# Patient Record
Sex: Female | Born: 1995 | Race: White | Hispanic: Yes | Marital: Single | Smoking: Never smoker
Health system: Southern US, Community
[De-identification: ages and names within clinical notes are randomized; demographics above are authoritative.]

## PROBLEM LIST (undated history)

## (undated) HISTORY — PX: WISDOM TOOTH EXTRACTION: SHX21

---
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
## 2017-01-05 ENCOUNTER — Ambulatory Visit: Payer: Medicaid Other | Admitting: Podiatry

## 2017-08-04 ENCOUNTER — Encounter: Payer: Self-pay | Admitting: Emergency Medicine

## 2017-08-04 DIAGNOSIS — L02415 Cutaneous abscess of right lower limb: Secondary | ICD-10-CM | POA: Diagnosis present

## 2017-08-04 DIAGNOSIS — L03115 Cellulitis of right lower limb: Secondary | ICD-10-CM | POA: Insufficient documentation

## 2017-08-04 NOTE — ED Triage Notes (Signed)
Patient reports noticed "bump" on right upper leg approximately 1 week ago.  States has gotten larger with redness and pain now.

## 2017-08-05 ENCOUNTER — Emergency Department
Admission: EM | Admit: 2017-08-05 | Discharge: 2017-08-05 | Disposition: A | Discharge status: Home / Self Care | Payer: Medicaid Other | Attending: Emergency Medicine | Admitting: Emergency Medicine

## 2017-08-05 DIAGNOSIS — L0291 Cutaneous abscess, unspecified: Secondary | ICD-10-CM

## 2017-08-05 DIAGNOSIS — L03115 Cellulitis of right lower limb: Secondary | ICD-10-CM

## 2017-08-05 LAB — CBC WITH DIFFERENTIAL/PLATELET
Basophils Absolute: 0.1 10*3/uL (ref 0–0.1)
Basophils Relative: 1 %
Eosinophils Absolute: 0 10*3/uL (ref 0–0.7)
Eosinophils Relative: 0 %
HEMATOCRIT: 39.5 % (ref 35.0–47.0)
HEMOGLOBIN: 13.6 g/dL (ref 12.0–16.0)
LYMPHS ABS: 3.4 10*3/uL (ref 1.0–3.6)
LYMPHS PCT: 29 %
MCH: 30.7 pg (ref 26.0–34.0)
MCHC: 34.4 g/dL (ref 32.0–36.0)
MCV: 89.5 fL (ref 80.0–100.0)
MONOS PCT: 5 %
Monocytes Absolute: 0.6 10*3/uL (ref 0.2–0.9)
NEUTROS PCT: 65 %
Neutro Abs: 7.6 10*3/uL — ABNORMAL HIGH (ref 1.4–6.5)
Platelets: 276 10*3/uL (ref 150–440)
RBC: 4.42 MIL/uL (ref 3.80–5.20)
RDW: 12.7 % (ref 11.5–14.5)
WBC: 11.8 10*3/uL — ABNORMAL HIGH (ref 3.6–11.0)

## 2017-08-05 LAB — BASIC METABOLIC PANEL
ANION GAP: 9 (ref 5–15)
BUN: 9 mg/dL (ref 6–20)
CO2: 28 mmol/L (ref 22–32)
Calcium: 9.6 mg/dL (ref 8.9–10.3)
Chloride: 103 mmol/L (ref 101–111)
Creatinine, Ser: 0.88 mg/dL (ref 0.44–1.00)
Glucose, Bld: 88 mg/dL (ref 65–99)
POTASSIUM: 3.5 mmol/L (ref 3.5–5.1)
SODIUM: 140 mmol/L (ref 135–145)

## 2017-08-05 MED ORDER — DEXTROSE 5 % IV SOLN
1.0000 g | Freq: Once | INTRAVENOUS | Status: AC
  Administered 2017-08-05: 1 g via INTRAVENOUS
  Filled 2017-08-05: qty 10

## 2017-08-05 MED ORDER — ONDANSETRON HCL 4 MG/2ML IJ SOLN
4.0000 mg | Freq: Once | INTRAMUSCULAR | Status: AC
  Administered 2017-08-05: 4 mg via INTRAVENOUS
  Filled 2017-08-05: qty 2

## 2017-08-05 MED ORDER — MORPHINE SULFATE (PF) 4 MG/ML IV SOLN
4.0000 mg | Freq: Once | INTRAVENOUS | Status: AC
  Administered 2017-08-05: 4 mg via INTRAVENOUS
  Filled 2017-08-05: qty 1

## 2017-08-05 MED ORDER — CEPHALEXIN 500 MG PO CAPS: 500.0000 mg | ORAL_CAPSULE | Freq: Three times a day (TID) | ORAL | 0 refills | Status: AC

## 2017-08-05 MED ORDER — LIDOCAINE HCL (PF) 1 % IJ SOLN
INTRAMUSCULAR | Status: AC
  Filled 2017-08-05: qty 5

## 2017-08-05 MED ORDER — SULFAMETHOXAZOLE-TRIMETHOPRIM 800-160 MG PO TABS: 2.0000 | ORAL_TABLET | Freq: Two times a day (BID) | ORAL | 0 refills | Status: AC

## 2017-08-05 MED ORDER — IBUPROFEN 600 MG PO TABS: 600.0000 mg | ORAL_TABLET | Freq: Three times a day (TID) | ORAL | 0 refills | Status: AC | PRN

## 2017-08-05 MED ORDER — HYDROCODONE-ACETAMINOPHEN 5-325 MG PO TABS: 1.0000 | ORAL_TABLET | Freq: Four times a day (QID) | ORAL | 0 refills | Status: AC | PRN

## 2017-08-05 MED ORDER — SULFAMETHOXAZOLE-TRIMETHOPRIM 800-160 MG PO TABS
2.0000 | ORAL_TABLET | Freq: Once | ORAL | Status: AC
  Administered 2017-08-05: 2 via ORAL
  Filled 2017-08-05: qty 2

## 2017-08-05 NOTE — ED Notes (Signed)
Patient with abscess to left inner thigh. Patient states that she noticed Sunday and that it has become worse.

## 2017-08-05 NOTE — Discharge Instructions (Signed)
1. Take antibiotics as prescribed: Keflex 500 mg 3 times daily 10 days Septra DS 2 tablets twice daily 10 days 2. You may take pain medicines as needed (Motrin/Norco #15). 3. Leave packing alone until you are seen for wound check in 2 days. 4. Return to the ER for worsening symptoms, persistent vomiting, fever, increased redness/swelling or other concerns.

## 2017-08-05 NOTE — ED Provider Notes (Signed)
[facility] Emergency Department Provider Note   ____________________________________________   First MD Initiated Contact with Patient 08/05/17 [phone]     (approximate)  I have reviewed the triage vital signs and the nursing notes.   HISTORY  Chief Complaint Abscess    HPI Gloria Jacobs is a 21 y.o. female who presents to the ED from home with a chief complaint of "bump" to the right leg. First noticed the painful swollen area on her right inner thigh approximately one week ago. Area has gotten progressively larger with spreading redness and pain. Denies associated fever, chills, chest pain, shortness of breath, abdominal pain, nausea, vomiting. Denies insect bite or trauma. She does shave her upper thighs.   Past medical history None  There are no active problems to display for this patient.   No past surgical history on file.  Prior to Admission medications   Not on File    Allergies Patient has no known allergies.  No family history on file.  Social History Social History  Substance Use Topics  . Smoking status: Not on file  . Smokeless tobacco: Not on file  . Alcohol use Not on file  N/A  Review of Systems  Constitutional: No fever/chills. Eyes: No visual changes. ENT: No sore throat. Cardiovascular: Denies chest pain. Respiratory: Denies shortness of breath. Gastrointestinal: No abdominal pain.  No nausea, no vomiting.  No diarrhea.  No constipation. Genitourinary: Negative for dysuria. Musculoskeletal: Negative for back pain. Skin: positive for abscess. Negative for rash. Neurological: Negative for headaches, focal weakness or numbness.   ____________________________________________   PHYSICAL EXAM:  VITAL SIGNS: ED Triage Vitals  Enc Vitals Group     BP 08/04/17 2346 120/78     Pulse Rate 08/04/17 2346 100     Resp 08/04/17 2346 18     Temp 08/04/17 2346 98.9 F (37.2 C)     Temp Source 08/04/17 2346 Oral   SpO2 08/04/17 2346 100 %     Weight --      Height --      Head Circumference --      Peak Flow --      Pain Score 08/04/17 2350 2     Pain Loc --      Pain Edu? --      Excl. in [place]? --     Constitutional: Alert and oriented. Well appearing and in no acute distress. Eyes: Conjunctivae are normal. PERRL. EOMI. Head: Atraumatic. Nose: No congestion/rhinnorhea. Mouth/Throat: Mucous membranes are moist.  Oropharynx non-erythematous. Neck: No stridor.   Cardiovascular: Normal rate, regular rhythm. Grossly normal heart sounds.  Good peripheral circulation. Respiratory: Normal respiratory effort.  No retractions. Lungs CTAB. Gastrointestinal: Soft and nontender. No distention. No abdominal bruits. No CVA tenderness. Musculoskeletal: Palm-sized area of warmth and erythema to right inner thigh with central 3 x 3 cm area of fluctuance. No pedal edema.  No joint effusions. Neurologic:  Normal speech and language. No gross focal neurologic deficits are appreciated.  Skin:  Skin is warm, dry and intact. No rash noted. Psychiatric: Mood and affect are normal. Speech and behavior are normal.  ____________________________________________   LABS (all labs ordered are listed, but only abnormal results are displayed)  Labs Reviewed  CBC WITH DIFFERENTIAL/PLATELET - Abnormal; Notable for the following:       Result Value   WBC 11.8 (*)    Neutro Abs 7.6 (*)    All other components within normal limits  CULTURE, BLOOD (ROUTINE X  2)  CULTURE, BLOOD (ROUTINE X 2)  BASIC METABOLIC PANEL   ____________________________________________  EKG  none ____________________________________________  RADIOLOGY  No results found.  ____________________________________________   PROCEDURES  Procedure(s) performed:  INCISION AND DRAINAGE Performed by: Irean Hong Consent: Verbal consent obtained. Risks and benefits: risks, benefits and alternatives were discussed Type: abscess  Body area:  right inner thigh  Anesthesia: local infiltration  Incision was made with a scalpel.  Local anesthetic: lidocaine 1% w/o epinephrine  Anesthetic total: 10 ml  Complexity: complex Blunt dissection to break up loculations  Drainage: purulent  Drainage amount: large  Packing material: 1/4 in iodoform gauze  Patient tolerance: Patient tolerated the procedure well with no immediate complications.    Procedures  Critical Care performed: No  ____________________________________________   INITIAL IMPRESSION / ASSESSMENT AND PLAN / ED COURSE  Pertinent labs & imaging results that were available during my care of the patient were reviewed by me and considered in my medical decision making (see chart for details).  21 year old female who presents with abscess and cellulitis to right inner thigh. Will obtain screening lab work, IV antibiotics and perform incision and drainage.  Clinical Course as of Aug 05 632  Wynelle Link Aug 05, 2017  1610 Patient tolerated I&D well. Updated patient and mother of laboratory results. She is to either see her PCP or return to the ED in 2 days for wound check. Will discharge home on Keflex and Septra. Prescription for analgesia as needed. Strict return precautions given. Both verbalize understanding and agree with plan of care.  [JS]    Clinical Course User Index [JS] Irean Hong, MD     ____________________________________________   FINAL CLINICAL IMPRESSION(S) / ED DIAGNOSES  Final diagnoses:  Abscess  Cellulitis of right lower extremity      NEW MEDICATIONS STARTED DURING THIS VISIT:  New Prescriptions   No medications on file     Note:  This document was prepared using Dragon voice recognition software and may include unintentional dictation errors.    Irean Hong, MD 08/05/17 [phone]

## 2017-08-10 LAB — CULTURE, BLOOD (ROUTINE X 2)
CULTURE: NO GROWTH
Culture: NO GROWTH
SPECIAL REQUESTS: ADEQUATE
Special Requests: ADEQUATE

## 2020-03-04 ENCOUNTER — Ambulatory Visit: Payer: Medicaid Other | Attending: Internal Medicine

## 2020-03-04 DIAGNOSIS — Z23 Encounter for immunization: Secondary | ICD-10-CM

## 2020-03-04 NOTE — Progress Notes (Signed)
   Covid-19 Vaccination Clinic  Name:  Gloria Jacobs    MRN: [id number] DOB: 10/11/1996  03/04/2020  [name] was observed post Covid-19 immunization for 15 minutes without incident. She was provided with Vaccine Information Sheet and instruction to access the V-Safe system.   [name] was instructed to call 911 with any severe reactions post vaccine: Marland Kitchen Difficulty breathing  . Swelling of face and throat  . A fast heartbeat  . A bad rash all over body  . Dizziness and weakness   Immunizations Administered    Name Date Dose VIS Date Route   Pfizer COVID-19 Vaccine 03/04/2020  2:40 PM 0.3 mL 11/07/2019 Intramuscular   Manufacturer: [employer], [employer]   Lot: [id number]   NDC: [address]-9

## 2020-03-19 ENCOUNTER — Other Ambulatory Visit: Payer: Self-pay

## 2020-03-19 ENCOUNTER — Encounter: Payer: Self-pay | Admitting: Emergency Medicine

## 2020-03-19 DIAGNOSIS — L509 Urticaria, unspecified: Secondary | ICD-10-CM | POA: Insufficient documentation

## 2020-03-19 DIAGNOSIS — R21 Rash and other nonspecific skin eruption: Secondary | ICD-10-CM | POA: Diagnosis present

## 2020-03-19 DIAGNOSIS — T7840XA Allergy, unspecified, initial encounter: Secondary | ICD-10-CM | POA: Diagnosis not present

## 2020-03-19 NOTE — ED Triage Notes (Signed)
Patient states that she started developing a rash all over 2 days ago. Patient states that it became worse today. Patient states that she took benadryl about an hour ago.

## 2020-03-20 ENCOUNTER — Emergency Department
Admission: EM | Admit: 2020-03-20 | Discharge: 2020-03-20 | Disposition: A | Discharge status: Home / Self Care | Payer: BC Managed Care – PPO | Attending: Emergency Medicine | Admitting: Emergency Medicine

## 2020-03-20 DIAGNOSIS — R21 Rash and other nonspecific skin eruption: Secondary | ICD-10-CM

## 2020-03-20 DIAGNOSIS — L509 Urticaria, unspecified: Secondary | ICD-10-CM

## 2020-03-20 MED ORDER — PREDNISONE 10 MG PO TABS: 10.0000 mg | ORAL_TABLET | Freq: Every day | ORAL | 0 refills | Status: AC

## 2020-03-20 MED ORDER — PREDNISONE 20 MG PO TABS
60.0000 mg | ORAL_TABLET | Freq: Once | ORAL | Status: AC
  Administered 2020-03-20: 02:00:00 60 mg via ORAL
  Filled 2020-03-20: qty 3

## 2020-03-20 NOTE — ED Provider Notes (Signed)
[facility] Emergency Department Provider Note  Time seen: 2:13 AM  I have reviewed the triage vital signs and the nursing notes.   HISTORY  Chief Complaint Rash   HPI Gloria Jacobs is a 24 y.o. female with no past medical history presents to the emergency department for hives over the past 3 days.  According to the patient 3 days ago she noticed hives across her body, states she took a shower but still had hives.  States they have been coming and going over the past 3 days.  Patient took Benadryl just prior to arrival and states that the hives are much improved but still present.  Patient denies any known allergies.  Denies any new exposures new foods new detergents, etc.  Denies any shortness of breath or wheeze.   History reviewed. No pertinent past medical history.  There are no problems to display for this patient.   Past Surgical History:  Procedure Laterality Date  . WISDOM TOOTH EXTRACTION      Prior to Admission medications   Medication Sig Start Date End Date Taking? Authorizing Provider  cephALEXin (KEFLEX) 500 MG capsule Take 1 capsule (500 mg total) by mouth 3 (three) times daily. 08/05/17   Paulette Blanch, MD  HYDROcodone-acetaminophen (NORCO) 5-325 MG tablet Take 1 tablet by mouth every 6 (six) hours as needed for moderate pain. 08/05/17   Paulette Blanch, MD  ibuprofen (ADVIL,MOTRIN) 600 MG tablet Take 1 tablet (600 mg total) by mouth every 8 (eight) hours as needed. 08/05/17   Paulette Blanch, MD  sulfamethoxazole-trimethoprim (BACTRIM DS,SEPTRA DS) 800-160 MG tablet Take 2 tablets by mouth 2 (two) times daily. 08/05/17   Paulette Blanch, MD    No Known Allergies  No family history on file.  Social History Social History   Tobacco Use  . Smoking status: Never Smoker  . Smokeless tobacco: Never Used  Substance Use Topics  . Alcohol use: Yes    Comment: occ  . Drug use: Never    Review of Systems Constitutional: Negative for fever. Cardiovascular:  Negative for chest pain. Respiratory: Negative for shortness of breath. Gastrointestinal: Negative for abdominal pain Musculoskeletal: Negative for musculoskeletal complaints Skin: Hives over her body. Neurological: Negative for headache All other ROS negative  ____________________________________________   PHYSICAL EXAM:  VITAL SIGNS: ED Triage Vitals [03/19/20 2318]  Enc Vitals Group     BP (!) 143/78     Pulse Rate 96     Resp 18     Temp 98.3 F (36.8 C)     Temp Source Oral     SpO2 100 %     Weight 170 lb (77.1 kg)     Height 5\' 4"  (1.626 m)     Head Circumference      Peak Flow      Pain Score 0     Pain Loc      Pain Edu?      Excl. in [place]?     Constitutional: Alert and oriented. Well appearing and in no distress. Eyes: Normal exam ENT      Head: Normocephalic and atraumatic.      Mouth/Throat: Mucous membranes are moist. Cardiovascular: Normal rate, regular rhythm.  Respiratory: Normal respiratory effort without tachypnea nor retractions. Breath sounds are clear.  No wheeze. Gastrointestinal: Soft and nontender. No distention.   Musculoskeletal: Nontender with normal range of motion in all extremities.  Neurologic:  Normal speech and language. No gross focal neurologic deficits  Skin:  Skin is warm.  Patient has several hives across her hands, upper extremities and back.  Mom states it is much improved from earlier tonight before the patient took Benadryl. Psychiatric: Mood and affect are normal.   ____________________________________________   INITIAL IMPRESSION / ASSESSMENT AND PLAN / ED COURSE  Pertinent labs & imaging results that were available during my care of the patient were reviewed by me and considered in my medical decision making (see chart for details).   Patient presents to the emergency department for rash.  Intermittent hives over the past 3 days.  Overall the patient appears well, no distress.  No wheeze or shortness of breath at any  point.  No throat swelling.  Patient does appear to have intermittent urticaria.  No new exposure identified.  We will place the patient on a prednisone taper.  Also discussed Benadryl or Zyrtec use as needed.  Patient agreeable to plan of care.  Gloria Jacobs was evaluated in Emergency Department on 03/20/2020 for the symptoms described in the history of present illness. She was evaluated in the context of the global COVID-19 pandemic, which necessitated consideration that the patient might be at risk for infection with the SARS-CoV-2 virus that causes COVID-19. Institutional protocols and algorithms that pertain to the evaluation of patients at risk for COVID-19 are in a state of rapid change based on information released by regulatory bodies including the CDC and federal and state organizations. These policies and algorithms were followed during the patient's care in the ED.  ____________________________________________   FINAL CLINICAL IMPRESSION(S) / ED DIAGNOSES  Hives Allergic reaction   Minna Antis, MD 03/20/20 [phone]

## 2020-03-29 ENCOUNTER — Ambulatory Visit: Payer: Medicaid Other

## 2020-04-06 ENCOUNTER — Ambulatory Visit: Payer: BC Managed Care – PPO | Attending: Internal Medicine

## 2020-04-06 DIAGNOSIS — Z23 Encounter for immunization: Secondary | ICD-10-CM

## 2020-04-06 NOTE — Progress Notes (Signed)
   Covid-19 Vaccination Clinic  Name:  Brooklen Runquist    MRN: [id number] DOB: [birth year]  04/06/2020  Ms. Raynor was observed post Covid-19 immunization for 15 minutes without incident. She was provided with Vaccine Information Sheet and instruction to access the V-Safe system.   Ms. Berroa was instructed to call 911 with any severe reactions post vaccine: Marland Kitchen Difficulty breathing  . Swelling of face and throat  . A fast heartbeat  . A bad rash all over body  . Dizziness and weakness   Immunizations Administered    Name Date Dose VIS Date Route   Pfizer COVID-19 Vaccine 04/06/2020  2:02 PM 0.3 mL 01/21/2019 Intramuscular   Manufacturer: [employer], [employer]   Lot: [id number]   NDC: [address]-1

## 2020-06-16 ENCOUNTER — Ambulatory Visit: Payer: BC Managed Care – PPO | Admitting: Obstetrics

## 2020-07-12 ENCOUNTER — Ambulatory Visit (INDEPENDENT_AMBULATORY_CARE_PROVIDER_SITE_OTHER): Payer: BC Managed Care – PPO | Admitting: Obstetrics

## 2020-07-12 ENCOUNTER — Other Ambulatory Visit (HOSPITAL_COMMUNITY)
Admission: RE | Admit: 2020-07-12 | Discharge: 2020-07-12 | Disposition: A | Discharge status: Home / Self Care | Payer: BC Managed Care – PPO | Source: Ambulatory Visit | Attending: Obstetrics | Admitting: Obstetrics

## 2020-07-12 ENCOUNTER — Other Ambulatory Visit: Payer: Self-pay

## 2020-07-12 ENCOUNTER — Encounter: Payer: Self-pay | Admitting: Obstetrics

## 2020-07-12 VITALS — BP 122/74 | Ht 64.0 in | Wt 177.0 lb

## 2020-07-12 DIAGNOSIS — Z124 Encounter for screening for malignant neoplasm of cervix: Secondary | ICD-10-CM | POA: Insufficient documentation

## 2020-07-12 DIAGNOSIS — Z30016 Encounter for initial prescription of transdermal patch hormonal contraceptive device: Secondary | ICD-10-CM | POA: Diagnosis not present

## 2020-07-12 DIAGNOSIS — Z113 Encounter for screening for infections with a predominantly sexual mode of transmission: Secondary | ICD-10-CM | POA: Diagnosis not present

## 2020-07-12 DIAGNOSIS — Z01419 Encounter for gynecological examination (general) (routine) without abnormal findings: Secondary | ICD-10-CM | POA: Insufficient documentation

## 2020-07-12 MED ORDER — XULANE 150-35 MCG/24HR TD PTWK: 1.0000 | MEDICATED_PATCH | TRANSDERMAL | 2 refills | Status: DC

## 2020-07-12 NOTE — Progress Notes (Signed)
Gynecology Annual Exam  PCP: Patient, No Pcp Per  Chief Complaint:  Chief Complaint  Patient presents with  . Annual Exam    History of Present Illness: Patient is a 24 y.o. G0P0000 presents for annual exam. The patient has no complaints today. She has become sexually active and they have used condoms. She is interested in other methods, so in addition to her first annual GYN physical, she wants to discuss birth control. 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  LMP: Patient's last menstrual period was 07/08/2020. Menarche:14 Average Interval: regular, 28 days Duration of flow: 7 days Heavy Menses: no Clots: no Intermenstrual Bleeding: no Postcoital Bleeding: no Dysmenorrhea: no  The patient is sexually active. She currently uses condoms for contraception. She denies dyspareunia.  The patient does not perform self breast exams.  There is no notable family history of breast or ovarian cancer in her family.  The patient wears seatbelts: yes.  The patient has regular exercise: not asked.    The patient denies current symptoms of depression.    Review of Systems: ROS  Past Medical History:  Patient Active Problem List   Diagnosis Date Noted  . Well woman exam with routine gynecological exam 07/12/2020    Past Surgical History:  Past Surgical History:  Procedure Laterality Date  . WISDOM TOOTH EXTRACTION      Gynecologic History:  Patient's last menstrual period was 07/08/2020. Contraception: condoms and would like to select another method Last Pap: Results were: none. This is her first pap.   Obstetric History: G0P0000  Family History:  No family history on file.  Social History:  Social History   Socioeconomic History  . Marital status: Single    Spouse name: Not on file  . Number of children: Not on file  . Years of education: Not on file  . Highest education level: Not on file  Occupational History  . Not on file  Tobacco Use  .  Smoking status: Never Smoker  . Smokeless tobacco: Never Used  Substance and Sexual Activity  . Alcohol use: Yes    Comment: occ  . Drug use: Never  . Sexual activity: Yes    Birth control/protection: None  Other Topics Concern  . Not on file  Social History Narrative  . Not on file   Social Determinants of Health   Financial Resource Strain:   . Difficulty of Paying Living Expenses:   Food Insecurity:   . Worried About Programme researcher, broadcasting/film/video in the Last Year:   . Barista in the Last Year:   Transportation Needs:   . Freight forwarder (Medical):   Marland Kitchen Lack of Transportation (Non-Medical):   Physical Activity:   . Days of Exercise per Week:   . Minutes of Exercise per Session:   Stress:   . Feeling of Stress :   Social Connections:   . Frequency of Communication with Friends and Family:   . Frequency of Social Gatherings with Friends and Family:   . Attends Religious Services:   . Active Member of Clubs or Organizations:   . Attends Banker Meetings:   Marland Kitchen Marital Status:   Intimate Partner Violence:   . Fear of Current or Ex-Partner:   . Emotionally Abused:   Marland Kitchen Physically Abused:   . Sexually Abused:     Allergies:  No Known Allergies  Medications: Prior to Admission medications   Medication Sig Start Date  End Date Taking? Authorizing Provider  cephALEXin (KEFLEX) 500 MG capsule Take 1 capsule (500 mg total) by mouth 3 (three) times daily. Patient not taking: Reported on 07/12/2020 08/05/17   Irean Hong, MD  HYDROcodone-acetaminophen [facility]) 5-325 MG tablet Take 1 tablet by mouth every 6 (six) hours as needed for moderate pain. Patient not taking: Reported on 07/12/2020 08/05/17   Irean Hong, MD  ibuprofen (ADVIL,MOTRIN) 600 MG tablet Take 1 tablet (600 mg total) by mouth every 8 (eight) hours as needed. Patient not taking: Reported on 07/12/2020 08/05/17   Irean Hong, MD  norelgestromin-ethinyl estradiol [facility]) 150-35 MCG/24HR transdermal patch  Place 1 patch onto the skin once a week. 07/12/20   Mirna Mires, CNM  predniSONE (DELTASONE) 10 MG tablet Take 1 tablet (10 mg total) by mouth daily. Day 1-3: take 4 tablets PO daily Day 4-6: take 3 tablets PO daily Day 7-9: take 2 tablets PO daily Day 10-12: take 1 tablet PO daily Patient not taking: Reported on 07/12/2020 03/20/20   Minna Antis, MD  sulfamethoxazole-trimethoprim (BACTRIM DS,SEPTRA DS) 800-160 MG tablet Take 2 tablets by mouth 2 (two) times daily. Patient not taking: Reported on 07/12/2020 08/05/17   Irean Hong, MD    Physical Exam Vitals: Blood pressure 122/74, height 5\' 4"  (1.626 m), weight 177 lb (80.3 kg), last menstrual period 07/08/2020.  General: NAD HEENT: normocephalic, anicteric Thyroid: no enlargement, no palpable nodules Pulmonary: No increased work of breathing, CTAB Cardiovascular: RRR, distal pulses 2+ Breast: Breast symmetrical, no tenderness, no palpable nodules or masses, no skin or nipple retraction present, no nipple discharge.  No axillary or supraclavicular lymphadenopathy. Abdomen: NABS, soft, non-tender, non-distended.  Umbilicus without lesions.  No hepatomegaly, splenomegaly or masses palpable. No evidence of hernia  Genitourinary:  External: Normal external female genitalia.  Normal urethral meatus, normal Bartholin's and Skene's glands.    Vagina: Normal vaginal mucosa, no evidence of prolapse.    Cervix: Grossly normal in appearance, no bleeding  Uterus: Non-enlarged, mobile, normal contour.  No CMT  Adnexa: ovaries non-enlarged, no adnexal masses  Rectal: deferred  Lymphatic: no evidence of inguinal lymphadenopathy Extremities: no edema, erythema, or tenderness Neurologic: Grossly intact Psychiatric: mood appropriate, affect full  Female chaperone present for pelvic and breast  portions of the physical exam    Assessment: 24 y.o. G0P0000 routine annual exam  Plan: Problem List Items Addressed This Visit      Other    Well woman exam with routine gynecological exam   Relevant Orders   Pap IG w/ reflex to HPV when ASC-U   HEP, RPR, HIV Panel   NuSwab Vaginitis Plus (VG+)    Other Visit Diagnoses    Women's annual routine gynecological examination    -  Primary   Relevant Medications   norelgestromin-ethinyl estradiol (XULANE) 150-35 MCG/24HR transdermal patch   Other Relevant Orders   Pap IG w/ reflex to HPV when ASC-U   HEP, RPR, HIV Panel   NuSwab Vaginitis Plus (VG+)   Cervical cancer screening       Relevant Orders   Pap IG w/ reflex to HPV when ASC-U   HEP, RPR, HIV Panel   Routine screening for STI (sexually transmitted infection)       Relevant Orders   HEP, RPR, HIV Panel   NuSwab Vaginitis Plus (VG+)   Encounter for initial prescription of transdermal patch hormonal contraceptive device       Relevant Medications   norelgestromin-ethinyl estradiol 21) 150-35  MCG/24HR transdermal patch      1) 4) Gardasil Series discussed and if applicable offered to patient - Patient has previously completed 3 shot series   2) STI screening  wasoffered and accepted  3)  ASCCP guidelines and rational discussed.  Patient opts for yearly screening interval  4) Contraception - the patient is currently using  contraceptive patch, ie the Xulane. She wants to trial this for the next three months, and will return to discuss whether this is her ongoing method of choice..  She is interested in starting Contraception: as mentioned- the patch. We discussed safe sex practices to reduce her furture risk of STI's.    5) Return in about 3 months (around 10/12/2020) for contraceptive follow up on the patch (xulane).   Mirna Mires, CNM  07/12/2020 6:03 PM    07/12/2020, 5:51 PM

## 2020-07-13 LAB — HEP, RPR, HIV PANEL
HIV Screen 4th Generation wRfx: NONREACTIVE
Hepatitis B Surface Ag: NEGATIVE
RPR Ser Ql: NONREACTIVE

## 2020-07-14 ENCOUNTER — Encounter: Payer: Self-pay | Admitting: Obstetrics

## 2020-07-14 LAB — NUSWAB VAGINITIS PLUS (VG+)
Candida albicans, NAA: NEGATIVE
Candida glabrata, NAA: NEGATIVE
Chlamydia trachomatis, NAA: NEGATIVE
Neisseria gonorrhoeae, NAA: NEGATIVE
Trich vag by NAA: NEGATIVE

## 2020-07-15 LAB — CYTOLOGY - PAP
Comment: NEGATIVE
Diagnosis: NEGATIVE
High risk HPV: NEGATIVE

## 2020-07-19 ENCOUNTER — Encounter: Payer: Self-pay | Admitting: Obstetrics

## 2020-07-28 ENCOUNTER — Other Ambulatory Visit: Payer: Self-pay | Admitting: Sleep Medicine

## 2020-07-28 ENCOUNTER — Other Ambulatory Visit: Payer: BC Managed Care – PPO

## 2020-07-28 DIAGNOSIS — I471 Supraventricular tachycardia, unspecified: Secondary | ICD-10-CM

## 2020-07-29 LAB — NOVEL CORONAVIRUS, NAA: SARS-CoV-2, NAA: NOT DETECTED

## 2020-10-14 ENCOUNTER — Ambulatory Visit: Payer: BC Managed Care – PPO | Admitting: Obstetrics and Gynecology

## 2020-10-15 ENCOUNTER — Ambulatory Visit: Payer: BC Managed Care – PPO | Admitting: Obstetrics

## 2021-10-27 ENCOUNTER — Ambulatory Visit: Payer: BC Managed Care – PPO | Admitting: Obstetrics

## 2021-10-27 ENCOUNTER — Encounter: Payer: Self-pay | Admitting: Obstetrics

## 2021-10-27 ENCOUNTER — Other Ambulatory Visit: Payer: Self-pay

## 2021-10-27 ENCOUNTER — Other Ambulatory Visit (HOSPITAL_COMMUNITY)
Admission: RE | Admit: 2021-10-27 | Discharge: 2021-10-27 | Disposition: A | Discharge status: Home / Self Care | Payer: Medicaid Other | Source: Ambulatory Visit | Attending: Obstetrics | Admitting: Obstetrics

## 2021-10-27 VITALS — BP 100/60 | Ht 64.0 in | Wt 181.0 lb

## 2021-10-27 DIAGNOSIS — Z113 Encounter for screening for infections with a predominantly sexual mode of transmission: Secondary | ICD-10-CM

## 2021-10-27 DIAGNOSIS — Z124 Encounter for screening for malignant neoplasm of cervix: Secondary | ICD-10-CM | POA: Diagnosis present

## 2021-10-27 DIAGNOSIS — Z3009 Encounter for other general counseling and advice on contraception: Secondary | ICD-10-CM

## 2021-10-27 DIAGNOSIS — Z23 Encounter for immunization: Secondary | ICD-10-CM

## 2021-10-27 DIAGNOSIS — Z01419 Encounter for gynecological examination (general) (routine) without abnormal findings: Secondary | ICD-10-CM

## 2021-10-27 MED ORDER — NORELGESTROMIN-ETH ESTRADIOL 150-35 MCG/24HR TD PTWK: 1.0000 | MEDICATED_PATCH | TRANSDERMAL | 3 refills | Status: AC

## 2021-10-28 LAB — HEP, RPR, HIV PANEL
HIV Screen 4th Generation wRfx: NONREACTIVE
Hepatitis B Surface Ag: NEGATIVE
RPR Ser Ql: NONREACTIVE

## 2021-10-30 ENCOUNTER — Encounter: Payer: Self-pay | Admitting: Obstetrics

## 2021-10-31 LAB — CERVICOVAGINAL ANCILLARY ONLY
Bacterial Vaginitis (gardnerella): POSITIVE — AB
Candida Glabrata: NEGATIVE
Candida Vaginitis: POSITIVE — AB
Chlamydia: NEGATIVE
Comment: NEGATIVE
Comment: NEGATIVE
Comment: NEGATIVE
Comment: NEGATIVE
Comment: NEGATIVE
Comment: NORMAL
Neisseria Gonorrhea: NEGATIVE
Trichomonas: NEGATIVE

## 2021-11-01 ENCOUNTER — Encounter: Payer: Self-pay | Admitting: Obstetrics

## 2021-11-01 ENCOUNTER — Other Ambulatory Visit (INDEPENDENT_AMBULATORY_CARE_PROVIDER_SITE_OTHER): Payer: BC Managed Care – PPO | Admitting: Obstetrics

## 2021-11-01 DIAGNOSIS — B9689 Other specified bacterial agents as the cause of diseases classified elsewhere: Secondary | ICD-10-CM

## 2021-11-01 DIAGNOSIS — N760 Acute vaginitis: Secondary | ICD-10-CM | POA: Diagnosis not present

## 2021-11-01 DIAGNOSIS — B3731 Acute candidiasis of vulva and vagina: Secondary | ICD-10-CM | POA: Diagnosis not present

## 2021-11-01 MED ORDER — FLUCONAZOLE 150 MG PO TABS: 150.0000 mg | ORAL_TABLET | Freq: Once | ORAL | 0 refills | Status: AC

## 2021-11-01 MED ORDER — METRONIDAZOLE 500 MG PO TABS: 500.0000 mg | ORAL_TABLET | Freq: Two times a day (BID) | ORAL | 0 refills | Status: AC

## 2021-11-01 NOTE — Progress Notes (Addendum)
Gynecology Annual Exam  PCP: Patient, No Pcp Per (Inactive)  Chief Complaint: No chief complaint on file.   History of Present Illness:  Ms. Gloria Jacobs is a 25 y.o. G0P0000 who LMP was Patient's last menstrual period was 10/07/2021., presents today for her annual examination.  Her menses are regular every 28-30 days, lasting 5 day(s).  Dysmenorrhea none. She does not have intermenstrual bleeding.  She is single partner, contraception - condoms most of the time.  Last Pap: July 02, 2021  Results were: no abnormalities /neg HPV DNA negative Hx of STDs: none  There is no FH of breast cancer. There is no FH of ovarian cancer. The patient does not do self-breast exams.  Tobacco use: The patient denies current or previous tobacco use. Alcohol use: social drinker Exercise: not active    The patient wears seatbelts: yes.   The patient reports that domestic violence in her life is absent.   No past medical history on file.  Past Surgical History:  Procedure Laterality Date   WISDOM TOOTH EXTRACTION      Prior to Admission medications   Medication Sig Start Date End Date Taking? Authorizing Provider  cephALEXin (KEFLEX) 500 MG capsule Take 1 capsule (500 mg total) by mouth 3 (three) times daily. Patient not taking: Reported on 07/12/2020 08/05/17   Irean Hong, MD  HYDROcodone-acetaminophen [facility]) 5-325 MG tablet Take 1 tablet by mouth every 6 (six) hours as needed for moderate pain. Patient not taking: Reported on 07/12/2020 08/05/17   Irean Hong, MD  ibuprofen (ADVIL,MOTRIN) 600 MG tablet Take 1 tablet (600 mg total) by mouth every 8 (eight) hours as needed. Patient not taking: Reported on 07/12/2020 08/05/17   Irean Hong, MD  norelgestromin-ethinyl estradiol [facility]) 150-35 MCG/24HR transdermal patch Place 1 patch onto the skin once a week. 10/27/21   Mirna Mires, CNM  predniSONE (DELTASONE) 10 MG tablet Take 1 tablet (10 mg total) by mouth daily. Day 1-3: take 4 tablets PO  daily Day 4-6: take 3 tablets PO daily Day 7-9: take 2 tablets PO daily Day 10-12: take 1 tablet PO daily Patient not taking: Reported on 07/12/2020 03/20/20   Minna Antis, MD  sulfamethoxazole-trimethoprim (BACTRIM DS,SEPTRA DS) 800-160 MG tablet Take 2 tablets by mouth 2 (two) times daily. Patient not taking: Reported on 07/12/2020 08/05/17   Irean Hong, MD    No Known Allergies  Gynecologic History: Patient's last menstrual period was 10/07/2021. History of abnormal pap smear: No History of STI: No   Obstetric History: G0P0000  Social History   Socioeconomic History   Marital status: Single    Spouse name: Not on file   Number of children: Not on file   Years of education: Not on file   Highest education level: Not on file  Occupational History   Not on file  Tobacco Use   Smoking status: Never   Smokeless tobacco: Never  Substance and Sexual Activity   Alcohol use: Yes    Comment: occ   Drug use: Never   Sexual activity: Yes    Birth control/protection: None  Other Topics Concern   Not on file  Social History Narrative   Not on file   Social Determinants of Health   Financial Resource Strain: Not on file  Food Insecurity: Not on file  Transportation Needs: Not on file  Physical Activity: Not on file  Stress: Not on file  Social Connections: Not on file  Intimate Partner Violence: Not  on file    No family history on file.  ROS   Physical Exam LMP 10/07/2021    OBGyn Exam  Female chaperone present for pelvic and breast  portions of the physical exam  Results:  PHQ-9: 5   Assessment: 25 y.o. G0P0000 female here for routine annual gynecologic examination  Plan: Problem List Items Addressed This Visit   None Visit Diagnoses     Yeast infection of the vagina    -  Primary   BV (bacterial vaginosis)           Screening: -- Blood pressure screen normal -- Weight screening: overweight: continue to monitor -- Depression screening  negative (PHQ-9) -- Nutrition: normal -- cholesterol screening: not due for screening -- osteoporosis screening: not due -- tobacco screening: not using -- alcohol screening: AUDIT questionnaire indicates low-risk usage. -- family history of breast cancer screening: done. not at high risk. -- no evidence of domestic violence or intimate partner violence. -- STD screening: gonorrhea/chlamydia NAAT collected -- pap smear not collected per ASCCP guidelines -- flu vaccine  per PCP -- HPV vaccination series: received  Mirna Mires, CNM  11/14/2021 2:25 PM   11/14/2021 2:21 PM     Patient does not have MyChart set up,o called to let her know that she showed BV and Candida on her Nuswab.

## 2021-11-04 ENCOUNTER — Other Ambulatory Visit: Payer: Self-pay | Admitting: Obstetrics

## 2021-11-04 DIAGNOSIS — B3731 Acute candidiasis of vulva and vagina: Secondary | ICD-10-CM

## 2021-11-04 MED ORDER — FLUCONAZOLE 150 MG PO TABS: 150.0000 mg | ORAL_TABLET | Freq: Once | ORAL | 0 refills | Status: AC

## 2021-11-14 LAB — CYTOLOGY - PAP

## 2021-11-14 NOTE — Addendum Note (Signed)
Addended by: Mirna Mires on: 11/14/2021 02:25 PM   Modules accepted: Level of Service

## 2021-11-16 ENCOUNTER — Encounter: Payer: Self-pay | Admitting: Obstetrics

## 2021-11-18 NOTE — Progress Notes (Unsigned)
Attempted to contact patient to discuss her LGSIL pap results. She is to repeat the pap in one year.she did not answer her phone, and does not have My Chart set up. VM left advising her to call the office after the Christmas holiday to discuss her pap smear result. (LGSIL).  Mirna Mires, CNM  11/18/2021 5:14 PM

## 2022-11-28 ENCOUNTER — Encounter: Payer: Self-pay | Admitting: *Deleted

## 2022-11-28 ENCOUNTER — Other Ambulatory Visit: Payer: Self-pay

## 2022-11-28 ENCOUNTER — Emergency Department: Payer: Medicaid Other

## 2022-11-28 ENCOUNTER — Emergency Department
Admission: EM | Admit: 2022-11-28 | Discharge: 2022-11-28 | Disposition: A | Discharge status: Home / Self Care | Payer: Medicaid Other | Attending: Emergency Medicine | Admitting: Emergency Medicine

## 2022-11-28 DIAGNOSIS — I880 Nonspecific mesenteric lymphadenitis: Secondary | ICD-10-CM | POA: Insufficient documentation

## 2022-11-28 LAB — URINALYSIS, ROUTINE W REFLEX MICROSCOPIC
Bilirubin Urine: NEGATIVE
Glucose, UA: NEGATIVE mg/dL
Hgb urine dipstick: NEGATIVE
Ketones, ur: NEGATIVE mg/dL
Nitrite: NEGATIVE
Protein, ur: NEGATIVE mg/dL
Specific Gravity, Urine: 1.026 (ref 1.005–1.030)
pH: 6 (ref 5.0–8.0)

## 2022-11-28 LAB — COMPREHENSIVE METABOLIC PANEL
ALT: 22 U/L (ref 0–44)
AST: 21 U/L (ref 15–41)
Albumin: 4.1 g/dL (ref 3.5–5.0)
Alkaline Phosphatase: 72 U/L (ref 38–126)
Anion gap: 10 (ref 5–15)
BUN: 12 mg/dL (ref 6–20)
CO2: 24 mmol/L (ref 22–32)
Calcium: 9.2 mg/dL (ref 8.9–10.3)
Chloride: 104 mmol/L (ref 98–111)
Creatinine, Ser: 0.87 mg/dL (ref 0.44–1.00)
GFR, Estimated: >60 mL/min (ref >60)
Glucose, Bld: 92 mg/dL (ref 70–99)
Potassium: 3.6 mmol/L (ref 3.5–5.1)
Sodium: 138 mmol/L (ref 135–145)
Total Bilirubin: 0.6 mg/dL (ref 0.3–1.2)
Total Protein: 7.7 g/dL (ref 6.5–8.1)

## 2022-11-28 LAB — CBC
HCT: 42.7 % (ref 36.0–46.0)
Hemoglobin: 14.5 g/dL (ref 12.0–15.0)
MCH: 30.9 pg (ref 26.0–34.0)
MCHC: 34 g/dL (ref 30.0–36.0)
MCV: 90.9 fL (ref 80.0–100.0)
Platelets: 270 10*3/uL (ref 150–400)
RBC: 4.7 MIL/uL (ref 3.87–5.11)
RDW: 12.5 % (ref 11.5–15.5)
WBC: 11 10*3/uL — ABNORMAL HIGH (ref 4.0–10.5)
nRBC: 0 % (ref 0.0–0.2)

## 2022-11-28 LAB — POC URINE PREG, ED: Preg Test, Ur: NEGATIVE

## 2022-11-28 LAB — LIPASE, BLOOD: Lipase: 37 U/L (ref 11–51)

## 2022-11-28 MED ORDER — IOHEXOL 300 MG/ML  SOLN
100.0000 mL | Freq: Once | INTRAMUSCULAR | Status: AC | PRN
  Administered 2022-11-28: 100 mL via INTRAVENOUS

## 2022-11-28 NOTE — ED Provider Notes (Signed)
[facility] Provider Note  Patient Contact: 6:58 PM (approximate)   History   Abdominal Pain   HPI  Gloria Jacobs is a 27 y.o. female with an unremarkable past medical history, presents to the emergency department with right-sided mid abdominal pain that has occurred for the past 2 to 3 days without associated vomiting, fever or generalized malaise.  Patient states that she has had some viral URI-like symptoms.  No chest pain, chest tightness or shortness of breath.  No dysuria, hematuria or increased urinary frequency.      Physical Exam   Triage Vital Signs: ED Triage Vitals  Enc Vitals Group     BP 11/28/22 1644 118/87     Pulse Rate 11/28/22 1644 92     Resp 11/28/22 1644 18     Temp 11/28/22 1644 97.8 F (36.6 C)     Temp Source 11/28/22 1644 Oral     SpO2 11/28/22 1644 99 %     Weight 11/28/22 1641 188 lb (85.3 kg)     Height 11/28/22 1641 5\' 4"  (1.626 m)     Head Circumference --      Peak Flow --      Pain Score 11/28/22 1641 3     Pain Loc --      Pain Edu? --      Excl. in [place]? --     Most recent vital signs: Vitals:   11/28/22 1644  BP: 118/87  Pulse: 92  Resp: 18  Temp: 97.8 F (36.6 C)  SpO2: 99%     General: Alert and in no acute distress. Eyes:  PERRL. EOMI. Head: No acute traumatic findings ENT:      Nose: No congestion/rhinnorhea.      Mouth/Throat: Mucous membranes are moist. Neck: No stridor. No cervical spine tenderness to palpation. Cardiovascular:  Good peripheral perfusion Respiratory: Normal respiratory effort without tachypnea or retractions. Lungs CTAB. Good air entry to the bases with no decreased or absent breath sounds. Gastrointestinal: Bowel sounds 4 quadrants. Soft and nontender to palpation. No guarding or rigidity. No palpable masses. No distention. No CVA tenderness. Musculoskeletal: Full range of motion to all extremities.  Neurologic:  No gross focal neurologic deficits are appreciated.  Skin:    No rash noted Other:   ED Results / Procedures / Treatments   Labs (all labs ordered are listed, but only abnormal results are displayed) Labs Reviewed  CBC - Abnormal; Notable for the following components:      Result Value   WBC 11.0 (*)    All other components within normal limits  URINALYSIS, ROUTINE W REFLEX MICROSCOPIC - Abnormal; Notable for the following components:   Color, Urine YELLOW (*)    APPearance CLOUDY (*)    Leukocytes,Ua MODERATE (*)    Bacteria, UA MANY (*)    All other components within normal limits  LIPASE, BLOOD  COMPREHENSIVE METABOLIC PANEL  POC URINE PREG, ED      RADIOLOGY  I personally viewed and evaluated these images as part of my medical decision making, as well as reviewing the written report by the radiologist.  ED Provider Interpretation: No acute abnormality on CT abdomen pelvis.   PROCEDURES:  Critical Care performed: No  Procedures   MEDICATIONS ORDERED IN ED: Medications  iohexol (OMNIPAQUE) 300 MG/ML solution 100 mL (100 mLs Intravenous Contrast Given 11/28/22 1825)     IMPRESSION / MDM / ASSESSMENT AND PLAN / ED COURSE  I reviewed the triage vital  signs and the nursing notes.                              Assessment and plan Mesenteric lymphadenitis 27 year old female presents to the emergency department with right-sided mid abdominal pain for the past 2 to 3 days without associated vomiting and fever.  Vital signs were reassuring at triage.  On exam, patient was alert, active and nontoxic-appearing.  Patient had mild elevation white blood cell count.  CMP within range.  Lipase within range.  Urine pregnancy test was negative.  CT abdomen pelvis indicates prominent ileocolic lymph nodes but no other concerning findings.  Suspect mesenteric lymphadenitis at this time.  Will recommend Tylenol and ibuprofen alternating and observation of symptoms at home with strict return precautions given to return to the emergency  department with new or worsening symptoms.     FINAL CLINICAL IMPRESSION(S) / ED DIAGNOSES   Final diagnoses:  Mesenteric lymphadenitis     Rx / DC Orders   ED Discharge Orders     None        Note:  This document was prepared using Dragon voice recognition software and may include unintentional dictation errors.   Vallarie Mare Calhoun, PA-C 11/28/22 1901    Harvest Dark, MD 11/28/22 2238

## 2022-11-28 NOTE — ED Triage Notes (Signed)
Pt has intermittent right side abd pain for 6 days.  No n/v/  no fever.  No urinary sx.  No vag bleeding.  Pt alert  speech clear.

## 2022-11-28 NOTE — Discharge Instructions (Signed)
Alternate Tylenol and ibuprofen for pain

## 2022-11-28 NOTE — ED Provider Triage Note (Signed)
Emergency Medicine Provider Triage Evaluation Note  Gloria Jacobs, a 27 y.o. female  was evaluated in triage.  Pt complains of right-sided abdominal pain.  Patient reports intermittent symptoms for the last 6 days.  She denies any NVD, or FCS patient denies any dysuria or vaginal symptoms at this time.  Review of Systems  Positive: RLQ abd pain  Negative: FCS, NVD  Physical Exam  Ht 5\' 4"  (1.626 m)   Wt 85.3 kg   LMP 10/29/2022 (Approximate)   BMI 32.27 kg/m  Gen:   Awake, no distress  NAD Resp:  Normal effort CTA MSK:   Moves extremities without difficulty  Other:    Medical Decision Making  Medically screening exam initiated at 4:43 PM.  Appropriate orders placed.  Dayne Martinez was informed that the remainder of the evaluation will be completed by another provider, this initial triage assessment does not replace that evaluation, and the importance of remaining in the ED until their evaluation is complete.  To the ED for evaluation of 6 days of intermittent symptoms of right-sided abdominal pain.  She denies any associated NVD, FCS, dysuria, or vaginal complaints.   Melvenia Needles, PA-C 11/28/22 1644
